# Patient Record
Sex: Male | Born: 1995 | Race: Black or African American | Hispanic: No | Marital: Single | Smoking: Never smoker
Health system: Southern US, Community
[De-identification: ages and names within clinical notes are randomized; demographics above are authoritative.]

## PROBLEM LIST (undated history)

## (undated) DIAGNOSIS — F909 Attention-deficit hyperactivity disorder, unspecified type: Secondary | ICD-10-CM

## (undated) DIAGNOSIS — T751XXA Unspecified effects of drowning and nonfatal submersion, initial encounter: Secondary | ICD-10-CM

## (undated) DIAGNOSIS — J189 Pneumonia, unspecified organism: Secondary | ICD-10-CM

---

## 2018-08-24 ENCOUNTER — Emergency Department (HOSPITAL_BASED_OUTPATIENT_CLINIC_OR_DEPARTMENT_OTHER)
Admission: EM | Admit: 2018-08-24 | Discharge: 2018-08-24 | Disposition: A | Discharge status: Home / Self Care | Payer: Self-pay | Attending: Emergency Medicine | Admitting: Emergency Medicine

## 2018-08-24 ENCOUNTER — Encounter (HOSPITAL_BASED_OUTPATIENT_CLINIC_OR_DEPARTMENT_OTHER): Payer: Self-pay | Admitting: *Deleted

## 2018-08-24 ENCOUNTER — Emergency Department (HOSPITAL_BASED_OUTPATIENT_CLINIC_OR_DEPARTMENT_OTHER): Payer: Self-pay

## 2018-08-24 ENCOUNTER — Other Ambulatory Visit: Payer: Self-pay

## 2018-08-24 DIAGNOSIS — R112 Nausea with vomiting, unspecified: Secondary | ICD-10-CM | POA: Insufficient documentation

## 2018-08-24 DIAGNOSIS — J40 Bronchitis, not specified as acute or chronic: Secondary | ICD-10-CM | POA: Insufficient documentation

## 2018-08-24 DIAGNOSIS — R51 Headache: Secondary | ICD-10-CM | POA: Insufficient documentation

## 2018-08-24 DIAGNOSIS — R070 Pain in throat: Secondary | ICD-10-CM | POA: Insufficient documentation

## 2018-08-24 HISTORY — DX: Unspecified effects of drowning and nonfatal submersion, initial encounter: T75.1XXA

## 2018-08-24 HISTORY — DX: Attention-deficit hyperactivity disorder, unspecified type: F90.9

## 2018-08-24 HISTORY — DX: Pneumonia, unspecified organism: J18.9

## 2018-08-24 LAB — COMPREHENSIVE METABOLIC PANEL
ALT: 14 U/L (ref 0–44)
ANION GAP: 8 (ref 5–15)
AST: 21 U/L (ref 15–41)
Albumin: 5 g/dL (ref 3.5–5.0)
Alkaline Phosphatase: 82 U/L (ref 38–126)
BUN: 11 mg/dL (ref 6–20)
CHLORIDE: 104 mmol/L (ref 98–111)
CO2: 25 mmol/L (ref 22–32)
CREATININE: 1.03 mg/dL (ref 0.61–1.24)
Calcium: 9.3 mg/dL (ref 8.9–10.3)
GFR calc Af Amer: >60 mL/min (ref >60)
Glucose, Bld: 93 mg/dL (ref 70–99)
Potassium: 3.5 mmol/L (ref 3.5–5.1)
SODIUM: 137 mmol/L (ref 135–145)
Total Bilirubin: 2.9 mg/dL — ABNORMAL HIGH (ref 0.3–1.2)
Total Protein: 8.3 g/dL — ABNORMAL HIGH (ref 6.5–8.1)

## 2018-08-24 LAB — URINALYSIS, ROUTINE W REFLEX MICROSCOPIC
BILIRUBIN URINE: NEGATIVE
Glucose, UA: NEGATIVE mg/dL
HGB URINE DIPSTICK: NEGATIVE
KETONES UR: NEGATIVE mg/dL
Leukocytes, UA: NEGATIVE
NITRITE: NEGATIVE
PH: 5.5 (ref 5.0–8.0)
Protein, ur: NEGATIVE mg/dL

## 2018-08-24 LAB — CBC
HEMATOCRIT: 45.7 % (ref 39.0–52.0)
Hemoglobin: 15.6 g/dL (ref 13.0–17.0)
MCH: 31.2 pg (ref 26.0–34.0)
MCHC: 34.1 g/dL (ref 30.0–36.0)
MCV: 91.4 fL (ref 80.0–100.0)
Platelets: 303 10*3/uL (ref 150–400)
RBC: 5 MIL/uL (ref 4.22–5.81)
RDW: 12.4 % (ref 11.5–15.5)
WBC: 10.2 10*3/uL (ref 4.0–10.5)
nRBC: 0 % (ref 0.0–0.2)

## 2018-08-24 LAB — LIPASE, BLOOD: LIPASE: 32 U/L (ref 11–51)

## 2018-08-24 LAB — GROUP A STREP BY PCR: Group A Strep by PCR: NOT DETECTED

## 2018-08-24 MED ORDER — ONDANSETRON 4 MG PO TBDP: ORAL_TABLET | ORAL | 0 refills | Status: AC

## 2018-08-24 MED ORDER — SODIUM CHLORIDE 0.9 % IV BOLUS: 1000.0000 mL | Freq: Once | INTRAVENOUS | Status: DC

## 2018-08-24 MED ORDER — ACETAMINOPHEN 325 MG PO TABS
650.0000 mg | ORAL_TABLET | Freq: Once | ORAL | Status: AC | PRN
  Administered 2018-08-24: 650 mg via ORAL
  Filled 2018-08-24: qty 2

## 2018-08-24 MED ORDER — ONDANSETRON 4 MG PO TBDP
4.0000 mg | ORAL_TABLET | Freq: Once | ORAL | Status: AC | PRN
  Administered 2018-08-24: 4 mg via ORAL
  Filled 2018-08-24: qty 1

## 2018-08-24 MED ORDER — AMOXICILLIN-POT CLAVULANATE 875-125 MG PO TABS
1.0000 | ORAL_TABLET | Freq: Once | ORAL | Status: AC
  Administered 2018-08-24: 1 via ORAL
  Filled 2018-08-24: qty 1

## 2018-08-24 MED ORDER — AMOXICILLIN-POT CLAVULANATE 875-125 MG PO TABS: 1.0000 | ORAL_TABLET | Freq: Two times a day (BID) | ORAL | 0 refills | Status: AC
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
## 2018-08-24 NOTE — Discharge Instructions (Signed)
Take augmentin twice daily for a week for possible early pneumonia.   Stay hydrated.   Take zofran for nausea   See your doctor this week   Return to ER if you have persistent fever, vomiting , trouble breathing

## 2018-08-24 NOTE — ED Triage Notes (Addendum)
Fever n/v/d since 0300 today. Also c/o headache and throat pain. Pt also reports cough, fever, and sore throat

## 2019-11-09 IMAGING — CR DG CHEST 2V
2 series · 2 of 2 positions shown · non-contrast
Comparison: None.

CLINICAL DATA: Cough and fever beginning this morning.

EXAM:
CHEST - 2 VIEW

[w chest pa]
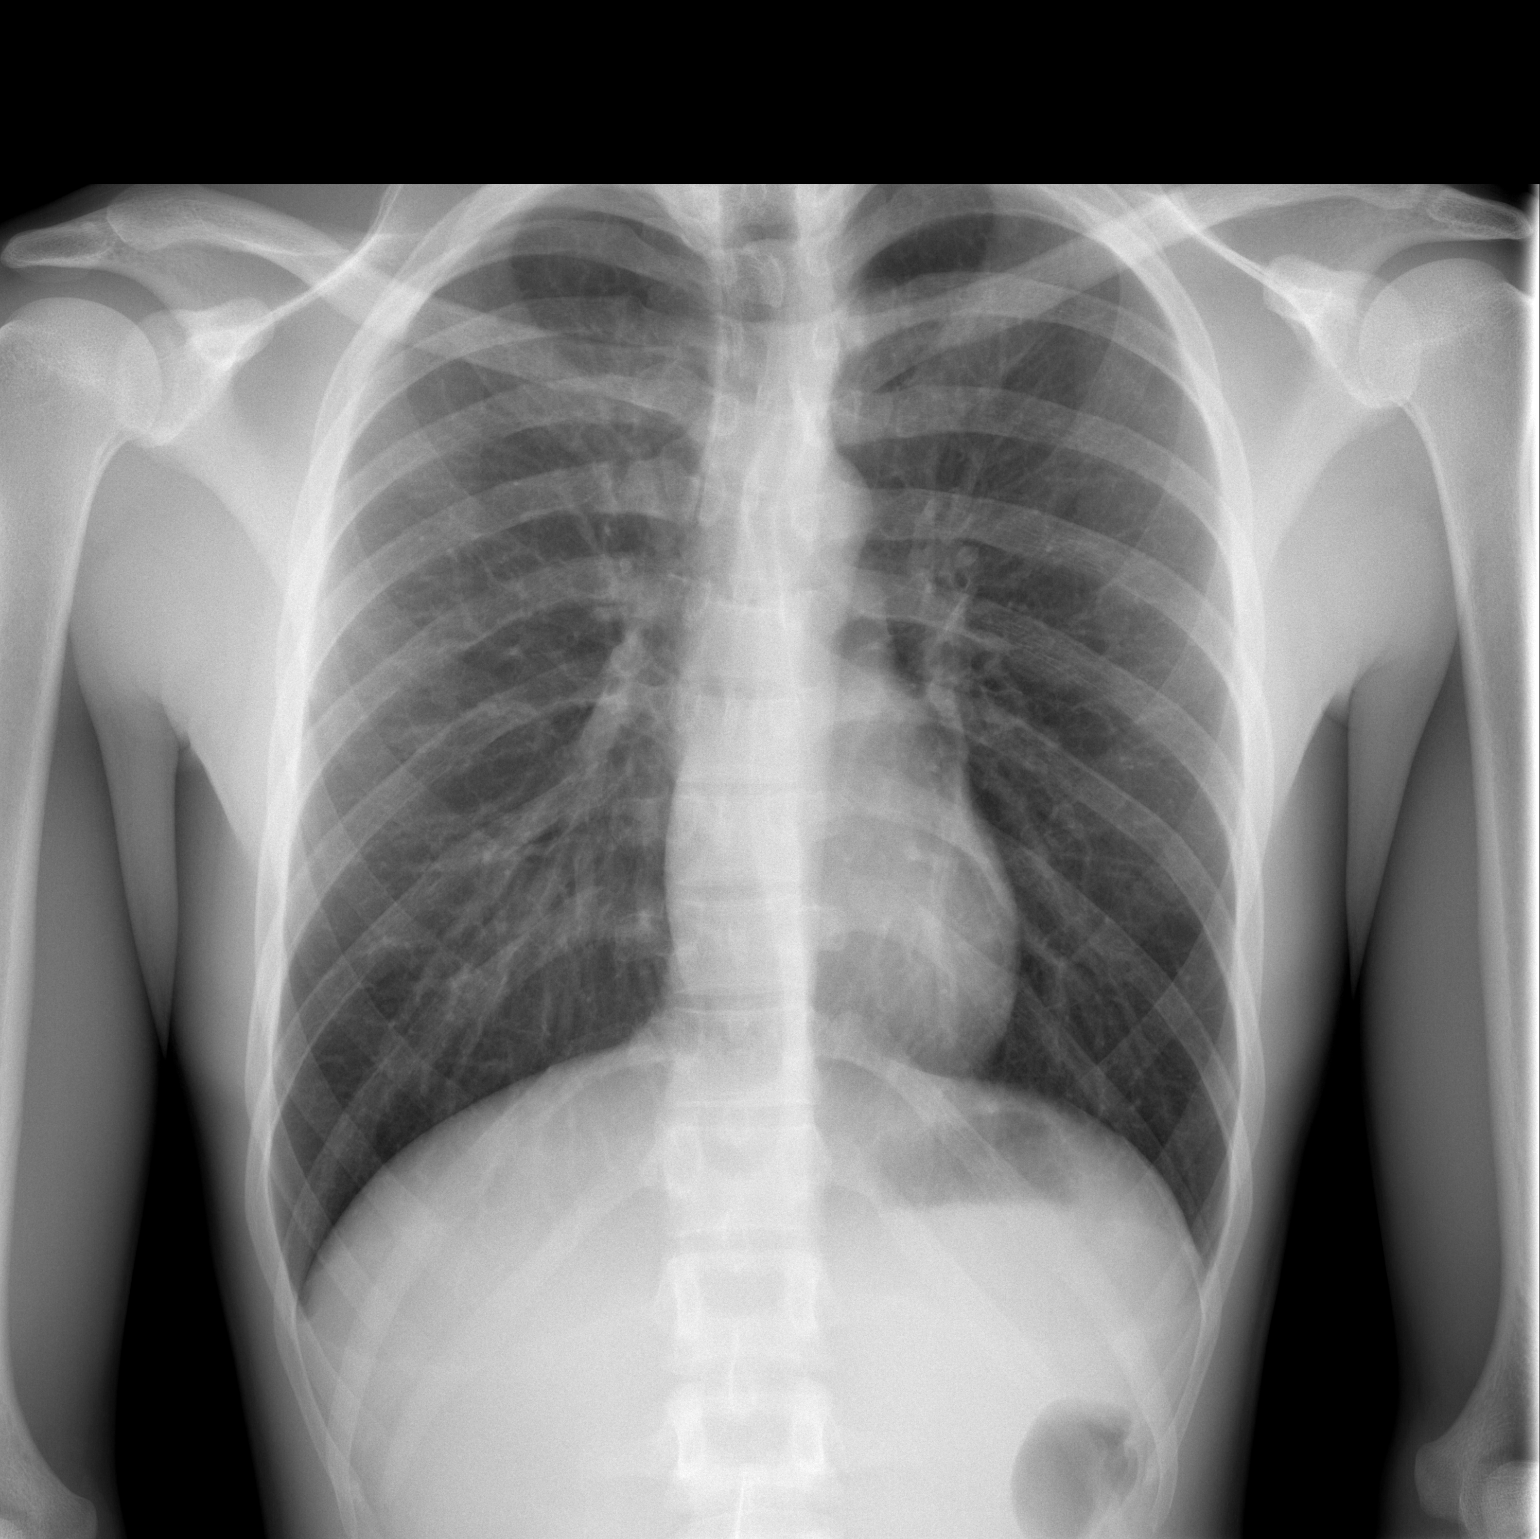

[w chest lat]
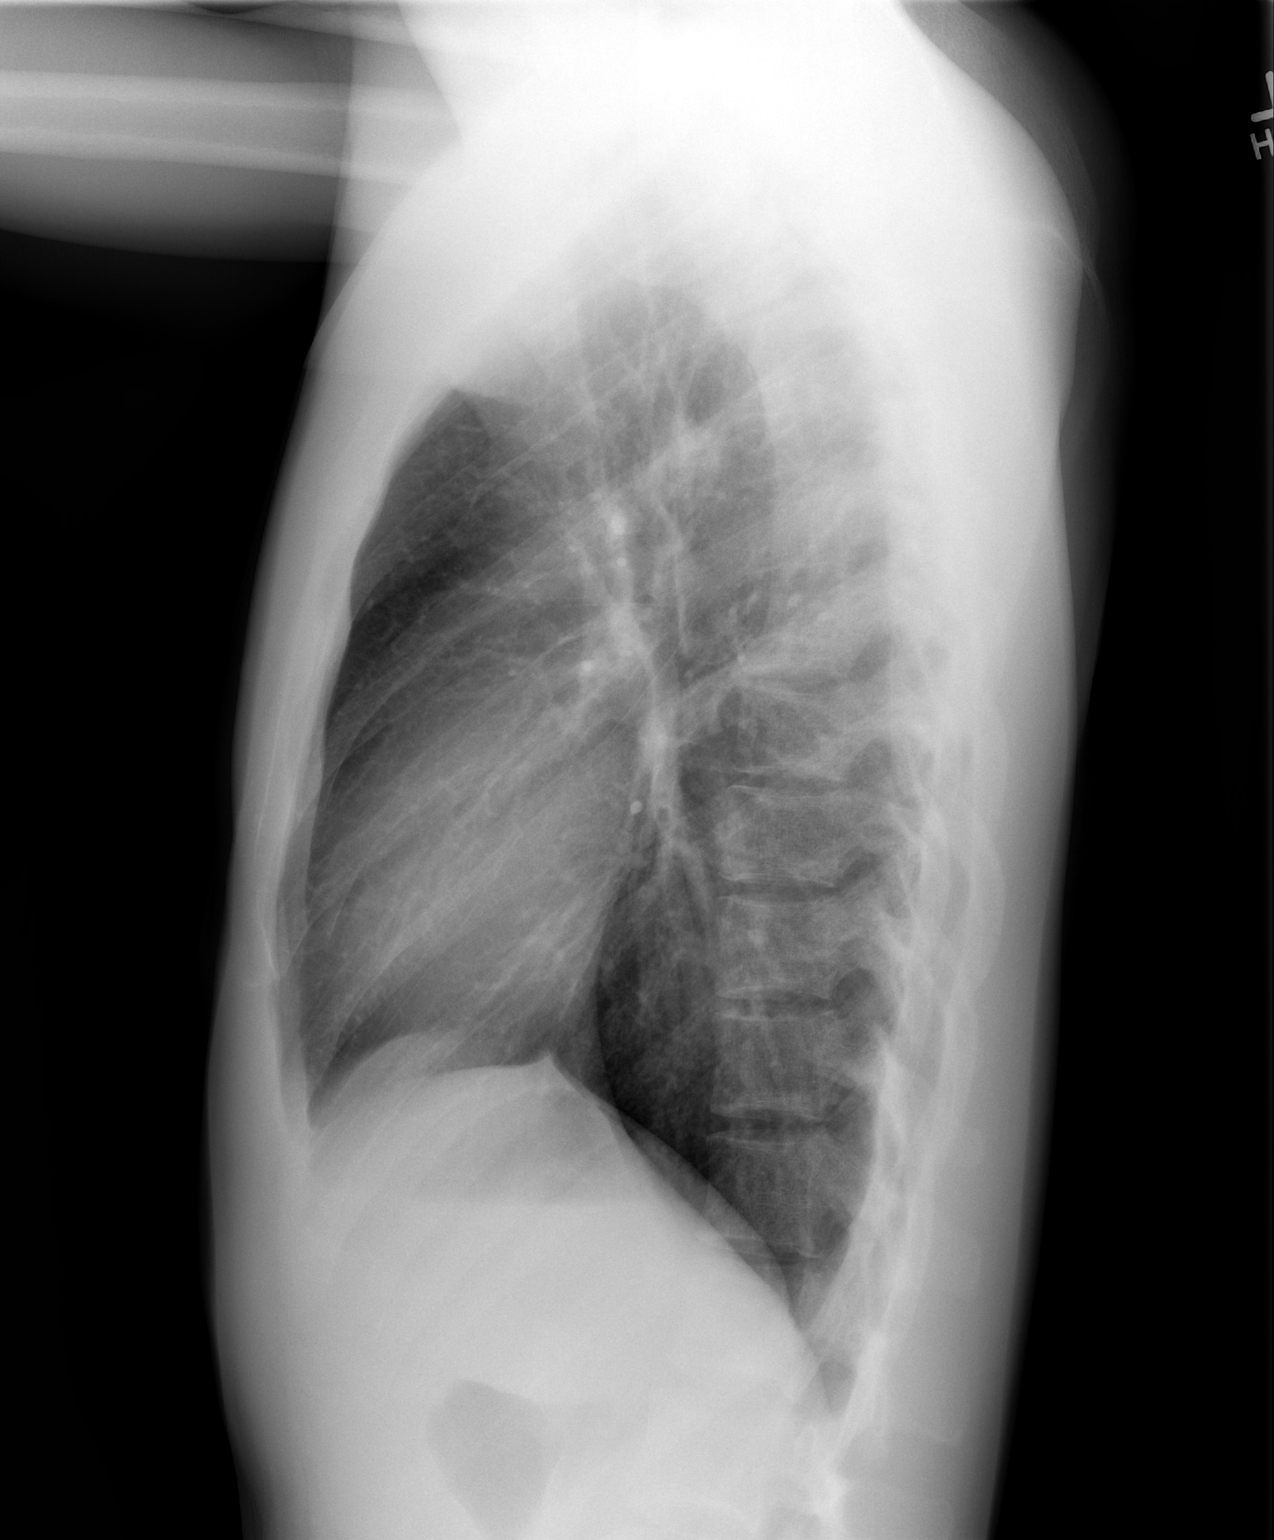

[2 of 2 positions shown; findings below may reference images not displayed]

FINDINGS: The heart size and mediastinal contours are within normal limits.
Both lungs are clear. The visualized skeletal structures are
unremarkable.
IMPRESSION: Negative.  No active cardiopulmonary disease.
